# Patient Record
Sex: Female | Born: 1990 | Race: White | Hispanic: No | Marital: Single | Smoking: Former smoker
Health system: Southern US, Community
[De-identification: ages and names within clinical notes are randomized; demographics above are authoritative.]

## PROBLEM LIST (undated history)

## (undated) DIAGNOSIS — R87810 Cervical high risk human papillomavirus (HPV) DNA test positive: Secondary | ICD-10-CM

## (undated) DIAGNOSIS — B977 Papillomavirus as the cause of diseases classified elsewhere: Secondary | ICD-10-CM

## (undated) HISTORY — DX: Cervical high risk human papillomavirus (HPV) DNA test positive: R87.810

## (undated) HISTORY — DX: Papillomavirus as the cause of diseases classified elsewhere: B97.7

---
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
## 2008-11-03 ENCOUNTER — Ambulatory Visit: Payer: Self-pay | Admitting: Gynecology

## 2008-11-03 ENCOUNTER — Other Ambulatory Visit: Admission: RE | Admit: 2008-11-03 | Discharge: 2008-11-03 | Payer: Self-pay | Admitting: Gynecology

## 2008-11-03 ENCOUNTER — Encounter: Payer: Self-pay | Admitting: Gynecology

## 2008-11-20 ENCOUNTER — Ambulatory Visit: Payer: Self-pay | Admitting: Gynecology

## 2008-11-20 ENCOUNTER — Encounter: Payer: Self-pay | Admitting: Gynecology

## 2008-11-20 HISTORY — PX: COLPOSCOPY: SHX161

## 2009-06-26 ENCOUNTER — Other Ambulatory Visit: Admission: RE | Admit: 2009-06-26 | Discharge: 2009-06-26 | Payer: Self-pay | Admitting: Gynecology

## 2009-06-26 ENCOUNTER — Ambulatory Visit: Payer: Self-pay | Admitting: Gynecology

## 2009-06-26 ENCOUNTER — Encounter: Payer: Self-pay | Admitting: Gynecology

## 2009-11-04 ENCOUNTER — Other Ambulatory Visit: Admission: RE | Admit: 2009-11-04 | Discharge: 2009-11-04 | Payer: Self-pay | Admitting: Gynecology

## 2009-11-04 ENCOUNTER — Ambulatory Visit: Payer: Self-pay | Admitting: Gynecology

## 2010-06-03 ENCOUNTER — Ambulatory Visit: Payer: Self-pay | Admitting: Gynecology

## 2011-01-04 ENCOUNTER — Encounter (INDEPENDENT_AMBULATORY_CARE_PROVIDER_SITE_OTHER): Payer: 59 | Admitting: Gynecology

## 2011-01-04 ENCOUNTER — Other Ambulatory Visit: Payer: Self-pay | Admitting: Gynecology

## 2011-01-04 ENCOUNTER — Other Ambulatory Visit (HOSPITAL_COMMUNITY)
Admission: RE | Admit: 2011-01-04 | Discharge: 2011-01-04 | Disposition: A | Discharge status: Home / Self Care | Payer: 59 | Source: Ambulatory Visit | Attending: Gynecology | Admitting: Gynecology

## 2011-01-04 DIAGNOSIS — R82998 Other abnormal findings in urine: Secondary | ICD-10-CM

## 2011-01-04 DIAGNOSIS — Z113 Encounter for screening for infections with a predominantly sexual mode of transmission: Secondary | ICD-10-CM

## 2011-01-04 DIAGNOSIS — Z01419 Encounter for gynecological examination (general) (routine) without abnormal findings: Secondary | ICD-10-CM

## 2011-01-04 DIAGNOSIS — B373 Candidiasis of vulva and vagina: Secondary | ICD-10-CM

## 2011-01-04 DIAGNOSIS — Z124 Encounter for screening for malignant neoplasm of cervix: Secondary | ICD-10-CM | POA: Insufficient documentation

## 2011-05-10 ENCOUNTER — Ambulatory Visit (INDEPENDENT_AMBULATORY_CARE_PROVIDER_SITE_OTHER): Payer: 59 | Admitting: Gynecology

## 2011-05-10 ENCOUNTER — Encounter: Payer: Self-pay | Admitting: Gynecology

## 2011-05-10 VITALS — BP 110/72

## 2011-05-10 DIAGNOSIS — M940 Chondrocostal junction syndrome [Tietze]: Secondary | ICD-10-CM

## 2011-05-10 NOTE — Progress Notes (Signed)
Patient is a 20 year old gravida 0 who presented to the office today stating for the past 3-4 days she's noticed some tenderness on the midportion of her right breast but no discernible mass per se. She is on 28 day oral contraceptive pills having normal menstrual cycles. She denies any recent trauma or injury to the breast. She denied a nipple discharge. No family history of breast cancer.  Breast exam: Both breasts were examined in sitting supine position both breasts are symmetrical in appearance no skin discoloration or nipple inversion no palpable masses no skin retraction. Left breast no tenderness elicited during the exam right breast no tenderness on the breast at the area of concern appears to be new the sternum. She was tender the costochondral junction with the sternum.  Assessment: Costochondritis  Plan: Patient will be placed on Motrin 800 mg for 10 days. If this has not resolved her symptoms she'll return back to the office and we'll schedule an ultrasound of the right breast. She was reassured and we'll follow accordingly.

## 2011-05-25 ENCOUNTER — Encounter: Payer: Self-pay | Admitting: *Deleted

## 2012-01-12 ENCOUNTER — Other Ambulatory Visit: Payer: Self-pay | Admitting: Gynecology

## 2012-02-15 ENCOUNTER — Other Ambulatory Visit: Payer: Self-pay | Admitting: Gynecology

## 2012-03-21 ENCOUNTER — Encounter: Payer: 59 | Admitting: Gynecology

## 2012-03-28 ENCOUNTER — Encounter: Payer: 59 | Admitting: Gynecology

## 2012-06-06 ENCOUNTER — Encounter: Payer: Self-pay | Admitting: Gynecology

## 2012-06-06 ENCOUNTER — Ambulatory Visit (INDEPENDENT_AMBULATORY_CARE_PROVIDER_SITE_OTHER): Payer: 59 | Admitting: Gynecology

## 2012-06-06 VITALS — BP 110/70 | Ht 66.0 in | Wt 119.0 lb

## 2012-06-06 DIAGNOSIS — B9689 Other specified bacterial agents as the cause of diseases classified elsewhere: Secondary | ICD-10-CM

## 2012-06-06 DIAGNOSIS — A499 Bacterial infection, unspecified: Secondary | ICD-10-CM

## 2012-06-06 DIAGNOSIS — N898 Other specified noninflammatory disorders of vagina: Secondary | ICD-10-CM

## 2012-06-06 DIAGNOSIS — Z113 Encounter for screening for infections with a predominantly sexual mode of transmission: Secondary | ICD-10-CM

## 2012-06-06 DIAGNOSIS — N949 Unspecified condition associated with female genital organs and menstrual cycle: Secondary | ICD-10-CM

## 2012-06-06 DIAGNOSIS — Z01419 Encounter for gynecological examination (general) (routine) without abnormal findings: Secondary | ICD-10-CM

## 2012-06-06 DIAGNOSIS — N760 Acute vaginitis: Secondary | ICD-10-CM

## 2012-06-06 LAB — WET PREP FOR TRICH, YEAST, CLUE
Trich, Wet Prep: NONE SEEN
Yeast Wet Prep HPF POC: NONE SEEN

## 2012-06-06 MED ORDER — LEVONORGESTREL-ETHINYL ESTRAD 0.1-20 MG-MCG PO TABS: ORAL_TABLET | ORAL | Status: DC

## 2012-06-06 MED ORDER — METRONIDAZOLE 500 MG PO TABS: 500.0000 mg | ORAL_TABLET | Freq: Two times a day (BID) | ORAL | Status: DC

## 2012-06-06 NOTE — Patient Instructions (Addendum)
Breast Self-Exam A self breast exam may help you find changes or problems while they are still small. Do a breast self-exam:  Every month.  One week after your period (menstrual period).  On the first day of each month if you do not have periods anymore. Look for any:  Change in breast color, size, or shape.  Dimples in your breast.  Changes in your nipples or skin.  Dry skin on your breasts or nipples.  Watery or bloody discharge from your nipples.  Feel for:  Lumps.  Thick, hard places.  Any other changes. HOME CARE There are 3 ways to do the breast self-exam: In front of a mirror.  Lift your arms over your head and turn side to side.  Put your hands on your hips and lean down, then turn from side to side.  Bend forward and turn from side to side. In the shower.  With soapy hands, check both breasts. Then check above and below your collarbone and your armpits.  Feel above and below your collarbone down to under your breast, and from the center of your chest to the outer edge of the armpit. Check for any lumps or hard spots.  Using the tips of your middle three fingers check your whole breast by pressing your hand over your breast in a circle or in an up and down motion. Lying down.  Lie flat on your bed.  Put a small pillow under the breast you are going to check. On that same side, put your hand behind your head.  With your other hand, use the 3 middle fingers to feel the breast.  Move your fingers in a circle around the breast. Press firmly over all parts of the breast to feel for any lumps. GET HELP RIGHT AWAY IF: You find any changes in your breasts so they can be checked. Document Released: 01/25/2008 Document Revised: 10/31/2011 Document Reviewed: 11/26/2008 [employer] Patient Information 2013 [employer]  Bacterial Vaginosis Bacterial vaginosis (BV) is a vaginal infection where the normal balance of bacteria in the vagina is disrupted. The normal  balance is then replaced by an overgrowth of certain bacteria. There are several different kinds of bacteria that can cause BV. BV is the most common vaginal infection in women of childbearing age. CAUSES   The cause of BV is not fully understood. BV develops when there is an increase or imbalance of harmful bacteria.  Some activities or behaviors can upset the normal balance of bacteria in the vagina and put women at increased risk including:  Having a new sex partner or multiple sex partners.  Douching.  Using an intrauterine device (IUD) for contraception.  It is not clear what role sexual activity plays in the development of BV. However, women that have never had sexual intercourse are rarely infected with BV. Women do not get BV from toilet seats, bedding, swimming pools or from touching objects around them.  SYMPTOMS   Grey vaginal discharge.  A fish-like odor with discharge, especially after sexual intercourse.  Itching or burning of the vagina and vulva.  Burning or pain with urination.  Some women have no signs or symptoms at all. DIAGNOSIS  Your caregiver must examine the vagina for signs of BV. Your caregiver will perform lab tests and look at the sample of vaginal fluid through a microscope. They will look for bacteria and abnormal cells (clue cells), a pH test higher than 4.5, and a positive amine test all associated with BV.  RISKS AND COMPLICATIONS   Pelvic inflammatory disease (PID).  Infections following gynecology surgery.  Developing HIV.  Developing herpes virus. TREATMENT  Sometimes BV will clear up without treatment. However, all women with symptoms of BV should be treated to avoid complications, especially if gynecology surgery is planned. Female partners generally do not need to be treated. However, BV may spread between female sex partners so treatment is helpful in preventing a recurrence of BV.   BV may be treated with antibiotics. The antibiotics come  in either pill or vaginal cream forms. Either can be used with nonpregnant or pregnant women, but the recommended dosages differ. These antibiotics are not harmful to the baby.  BV can recur after treatment. If this happens, a second round of antibiotics will often be prescribed.  Treatment is important for pregnant women. If not treated, BV can cause a premature delivery, especially for a pregnant woman who had a premature birth in the past. All pregnant women who have symptoms of BV should be checked and treated.  For chronic reoccurrence of BV, treatment with a type of prescribed gel vaginally twice a week is helpful. HOME CARE INSTRUCTIONS   Finish all medication as directed by your caregiver.  Do not have sex until treatment is completed.  Tell your sexual partner that you have a vaginal infection. They should see their caregiver and be treated if they have problems, such as a mild rash or itching.  Practice safe sex. Use condoms. Only have 1 sex partner. PREVENTION  Basic prevention steps can help reduce the risk of upsetting the natural balance of bacteria in the vagina and developing BV:  Do not have sexual intercourse (be abstinent).  Do not douche.  Use all of the medicine prescribed for treatment of BV, even if the signs and symptoms go away.  Tell your sex partner if you have BV. That way, they can be treated, if needed, to prevent reoccurrence. SEEK MEDICAL CARE IF:   Your symptoms are not improving after 3 days of treatment.  You have increased discharge, pain, or fever. MAKE SURE YOU:   Understand these instructions.  Will watch your condition.  Will get help right away if you are not doing well or get worse. FOR MORE INFORMATION  Division of STD Prevention (DSTDP), Centers for Disease Control and Prevention: [employer] American Social Health Association (ASHA): www.ashastd.org  Document Released: 08/08/2005 Document Revised: 10/31/2011 Document Reviewed:  01/29/2009 [facility] Patient Information 2013 [place], [place].  Oral Contraception Use Oral contraceptives (OCs) are medicines taken to prevent pregnancy. OCs work by preventing the ovaries from releasing eggs. The hormones in OCs also cause the cervical mucus to thicken, preventing the sperm from entering the uterus. The hormones also cause the uterine lining to become thin, not allowing a fertilized egg to attach to the inside of the uterus. OCs are highly effective when taken exactly as prescribed. However, OCs do not prevent sexually transmitted diseases (STDs). Safe sex practices, such as using condoms along with an OC, can help prevent STDs.  Before taking OCs, you may have a physical exam and Pap test. Your caregiver may also order blood tests if necessary. Your caregiver will make sure you are a good candidate for oral contraception. Discuss with your caregiver the possible side effects of the OC you may be prescribed. When starting an OC, it can take 2 to 3 months for the body to adjust to the changes in hormone levels in your body.  HOW TO TAKE ORAL  CONTRACEPTIVES Your caregiver may advise you on how to start taking the first cycle of OCs. Otherwise, you can:  Start on day 1 of your menstrual period. You will not need any backup contraceptive protection with this start time.  Start on the first Sunday after your menstrual period or the day you get your prescription. In these cases, you will need to use backup contraceptive protection for the first 7-day cycle. After you have started taking OCs:  If you forget to take 1 pill, take it as soon as you remember. Take the next pill at the regular time.  If you miss 2 or more pills, use backup birth control until your next menstrual period starts.  If you use a 28-day pack that contains inactive pills and you miss 1 of the last 7 pills (pills with no hormones), it will not matter. Throw away the rest of the non-hormone pills and start a new pill  pack. No matter which day you start the OC, you will always start a new pack on that same day of the week. Have an extra pack of OCs and a backup contraceptive method available in case you miss some pills or lose your OC pack. HOME CARE INSTRUCTIONS   Do not smoke.  Always use a condom to protect against STDs. OCs do not protect against STDs.  Use a calendar to mark your menstrual period days.  Read the information and directions that come with your OC. Talk to your caregiver if you have questions. SEEK MEDICAL CARE IF:   You develop nausea and vomiting.  You have abnormal vaginal discharge or bleeding.  You develop a rash.  You miss your menstrual period.  You are losing your hair.  You need treatment for mood swings or depression.  You get dizzy when taking the OC.  You develop acne from taking the OC.  You become pregnant. SEEK IMMEDIATE MEDICAL CARE IF:   You develop chest pain.  You develop shortness of breath.  You have an uncontrolled or severe headache.  You develop numbness or slurred speech.  You develop visual problems.  You develop pain, redness, and swelling in the legs. Document Released: 07/28/2011 Document Revised: 10/31/2011 Document Reviewed: 07/28/2011 [facility] Patient Information 2013 [place], [place].

## 2012-06-06 NOTE — Progress Notes (Signed)
Wendy Calderon 1990-11-03 [id number]   History:    21 y.o.  for annual gyn exam was complaining of vaginal discharge. Patient had ran out of her oral contraceptive pill Aleese. Has used condoms at times during intercourse. Her last menstrual cycle was approximately one week. Her cycles i and a few days ago. Patient wanted to have an STD screen. Patient frequently does her self breast examination. She has a steady sexual partner.  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 Colposcopy was negative ECC was benign. Followup Pap smears have been normal.  Past medical history,surgical history, family history and social history were all reviewed and documented in the EPIC chart.  Gynecologic History Patient's last menstrual period was 05/26/2012. Contraception: none Last Pap: 2012. Results were: normal Last mammogram: Not indicated. Results were: Not indicated  Obstetric History OB History    Grav Para Term Preterm Abortions TAB SAB Ect Mult Living                   ROS: A ROS was performed and pertinent positives and negatives are included in the history.  GENERAL: No fevers or chills. HEENT: No change in vision, no earache, sore throat or sinus congestion. NECK: No pain or stiffness. CARDIOVASCULAR: No chest pain or pressure. No palpitations. PULMONARY: No shortness of breath, cough or wheeze. GASTROINTESTINAL: No abdominal pain, nausea, vomiting or diarrhea, melena or bright red blood per rectum. GENITOURINARY: No urinary frequency, urgency, hesitancy or dysuria. MUSCULOSKELETAL: No joint or muscle pain, no back pain, no recent trauma. DERMATOLOGIC: No rash, no itching, no lesions. ENDOCRINE: No polyuria, polydipsia, no heat or cold intolerance. No recent change in weight. HEMATOLOGICAL: No anemia or easy bruising or bleeding. NEUROLOGIC: No headache, seizures, numbness, tingling or weakness.  PSYCHIATRIC: No depression, no loss of interest in normal activity or change in sleep pattern.     Exam: chaperone present  BP 110/70  Ht 5\' 6"  (1.676 m)  Wt 119 lb (53.978 kg)  BMI 19.21 kg/m2  LMP 05/26/2012  Body mass index is 19.21 kg/(m^2).  General appearance : Well developed well nourished female. No acute distress HEENT: Neck supple, trachea midline, no carotid bruits, no thyroidmegaly Lungs: Clear to auscultation, no rhonchi or wheezes, or rib retractions  Heart: Regular rate and rhythm, no murmurs or gallops Breast:Examined in sitting and supine position were symmetrical in appearance, no palpable masses or tenderness,  no skin retraction, no nipple inversion, no nipple discharge, no skin discoloration, no axillary or supraclavicular lymphadenopathy Abdomen: no palpable masses or tenderness, no rebound or guarding Extremities: no edema or skin discoloration or tenderness  Pelvic:  Bartholin, Urethra, Skene Glands: Within normal limits             Vagina: No gross lesions fishy odor creation discharge  Cervix: No gross lesions or discharge  Uterus  anteverted, normal size, shape and consistency, non-tender and mobile  Adnexa  Without masses or tenderness  Anus and perineum  normal   Rectovaginal  normal sphincter tone without palpated masses or tenderness             Hemoccult not indicated   Wet prep positive Amine, moderate clue cells many white blood cells and too numerous to count bacteria  Assessment/Plan:  21 y.o. female for annual exam with clinical evidence of bacterial vaginosis. Patient will be started on Flagyl 500 mg one by mouth  twice a day for 5 days. Prescription refill for Aleese was provided. Literature formation on self breast examination was provided. Patient was offered flu vaccine declined. Patient declined any blood work. GC and Chlamydia culture was done today resulting in time of this dictation. New Pap smear screening guidelines discussed. No Pap  smear done today.   Ok Edwards MD, 4:46 PM 06/06/2012

## 2012-06-07 LAB — URINALYSIS W MICROSCOPIC + REFLEX CULTURE
Crystals: NONE SEEN
Leukocytes, UA: NEGATIVE
Protein, ur: 30 mg/dL — AB
Specific Gravity, Urine: 1.022 (ref 1.005–1.030)
Squamous Epithelial / LPF: NONE SEEN
Urobilinogen, UA: 1 mg/dL (ref 0.0–1.0)

## 2012-06-07 LAB — GC/CHLAMYDIA PROBE AMP, GENITAL: Chlamydia, DNA Probe: NEGATIVE

## 2013-04-23 ENCOUNTER — Ambulatory Visit (INDEPENDENT_AMBULATORY_CARE_PROVIDER_SITE_OTHER): Payer: 59 | Admitting: Gynecology

## 2013-04-23 ENCOUNTER — Encounter: Payer: Self-pay | Admitting: Gynecology

## 2013-04-23 DIAGNOSIS — N898 Other specified noninflammatory disorders of vagina: Secondary | ICD-10-CM

## 2013-04-23 DIAGNOSIS — B9689 Other specified bacterial agents as the cause of diseases classified elsewhere: Secondary | ICD-10-CM

## 2013-04-23 DIAGNOSIS — A499 Bacterial infection, unspecified: Secondary | ICD-10-CM

## 2013-04-23 DIAGNOSIS — N760 Acute vaginitis: Secondary | ICD-10-CM

## 2013-04-23 LAB — WET PREP FOR TRICH, YEAST, CLUE: Trich, Wet Prep: NONE SEEN

## 2013-04-23 MED ORDER — METRONIDAZOLE 500 MG PO TABS: 500.0000 mg | ORAL_TABLET | Freq: Two times a day (BID) | ORAL | Status: DC

## 2013-04-23 NOTE — Patient Instructions (Signed)
Take Flagyl medication twice daily for 7 days. Avoid alcohol while taking. 

## 2013-04-23 NOTE — Progress Notes (Signed)
Patient presents complaining of vaginal discharge with odor for the past week. Does have a past history of bacterial vaginosis and said that it feels the same way. No urinary symptoms.  Exam with Kim assistant Abdomen soft nontender without masses guarding rebound organomegaly. Pelvic external BUS vagina with abundant white discharge. Cervix normal. Uterus normal size, mobile nontender. Adnexa without masses or tenderness.  Assessment and plan: History, exam and wet prep consistent with bacterial vaginosis. Treat with Flagyl 500 mg twice a day x7 days, alcohol avoidance reviewed. Followup if symptoms persist, worsen or recur.

## 2013-06-12 ENCOUNTER — Other Ambulatory Visit: Payer: Self-pay | Admitting: Gynecology

## 2013-06-12 NOTE — Telephone Encounter (Signed)
Will call pt to schedule annual

## 2013-08-16 ENCOUNTER — Other Ambulatory Visit: Payer: Self-pay | Admitting: Gynecology

## 2013-10-10 ENCOUNTER — Encounter: Payer: Self-pay | Admitting: Gynecology

## 2013-10-10 ENCOUNTER — Ambulatory Visit (INDEPENDENT_AMBULATORY_CARE_PROVIDER_SITE_OTHER): Payer: 59 | Admitting: Gynecology

## 2013-10-10 ENCOUNTER — Other Ambulatory Visit (HOSPITAL_COMMUNITY)
Admission: RE | Admit: 2013-10-10 | Discharge: 2013-10-10 | Disposition: A | Discharge status: Home / Self Care | Payer: 59 | Source: Ambulatory Visit | Attending: Gynecology | Admitting: Gynecology

## 2013-10-10 VITALS — BP 98/68 | Ht 67.0 in | Wt 122.4 lb

## 2013-10-10 DIAGNOSIS — Z01419 Encounter for gynecological examination (general) (routine) without abnormal findings: Secondary | ICD-10-CM | POA: Insufficient documentation

## 2013-10-10 DIAGNOSIS — Z113 Encounter for screening for infections with a predominantly sexual mode of transmission: Secondary | ICD-10-CM

## 2013-10-10 MED ORDER — LEVONORGESTREL-ETHINYL ESTRAD 0.1-20 MG-MCG PO TABS: 1.0000 | ORAL_TABLET | Freq: Every day | ORAL | Status: DC

## 2013-10-10 NOTE — Addendum Note (Signed)
Addended by: Bertram SavinGONZALEZ-CASTILLO, BLANCA A on: 10/10/2013 10:34 AM   Modules accepted: Orders

## 2013-10-10 NOTE — Patient Instructions (Signed)

## 2013-10-10 NOTE — Progress Notes (Signed)
    Wendy Calderon [birth year] [id number]   History:    23 y.o.  for annual gyn exam with no complaints today. Patient on oral contraceptive pill having normal cycles. Patient sexually active no change in partners. Patient had a normal Pap smear 2012.  Past medical history,surgical history, family history and social history were all reviewed and documented in the EPIC chart.  Gynecologic History Patient's last menstrual period was 09/09/2013. Contraception: OCP (estrogen/progesterone) Last Pap: 2012. Results were: normal Last mammogram: Not indicated. Results were: None indicated  Obstetric History OB History  No data available     ROS: A ROS was performed and pertinent positives and negatives are included in the history.  GENERAL: No fevers or chills. HEENT: No change in vision, no earache, sore throat or sinus congestion. NECK: No pain or stiffness. CARDIOVASCULAR: No chest pain or pressure. No palpitations. PULMONARY: No shortness of breath, cough or wheeze. GASTROINTESTINAL: No abdominal pain, nausea, vomiting or diarrhea, melena or bright red blood per rectum. GENITOURINARY: No urinary frequency, urgency, hesitancy or dysuria. MUSCULOSKELETAL: No joint or muscle pain, no back pain, no recent trauma. DERMATOLOGIC: No rash, no itching, no lesions. ENDOCRINE: No polyuria, polydipsia, no heat or cold intolerance. No recent change in weight. HEMATOLOGICAL: No anemia or easy bruising or bleeding. NEUROLOGIC: No headache, seizures, numbness, tingling or weakness. PSYCHIATRIC: No depression, no loss of interest in normal activity or change in sleep pattern.     Exam: chaperone present  BP 98/68  Ht 5\' 7"  (1.702 m)  Wt 122 lb 6.4 oz (55.52 kg)  BMI 19.17 kg/m2  LMP 09/09/2013  Body mass index is 19.17 kg/(m^2).  General appearance : Well developed well nourished female. No acute distress HEENT: Neck supple, trachea midline, no carotid bruits, no thyroidmegaly Lungs: Clear to  auscultation, no rhonchi or wheezes, or rib retractions  Heart: Regular rate and rhythm, no murmurs or gallops Breast:Examined in sitting and supine position were symmetrical in appearance, no palpable masses or tenderness,  no skin retraction, no nipple inversion, no nipple discharge, no skin discoloration, no axillary or supraclavicular lymphadenopathy Abdomen: no palpable masses or tenderness, no rebound or guarding Extremities: no edema or skin discoloration or tenderness  Pelvic:  Bartholin, Urethra, Skene Glands: Within normal limits             Vagina: No gross lesions or discharge  Cervix: No gross lesions or discharge  Uterus  anteverted, normal size, shape and consistency, non-tender and mobile  Adnexa  Without masses or tenderness  Anus and perineum  normal   Rectovaginal  normal sphincter tone without palpated masses or tenderness             Hemoccult indicated     Assessment/Plan:  23 y.o. female for annual exam will have a CBC, screening cholesterol and urinalysis along with Pap smear today. Patient was reminded on the importance of monthly breast exam. Literature and information on alternatives forms of contraception were provided to include IUD, transdermal implant, or vaginal ring.  Note: This dictation was prepared with  Dragon/digital dictation along withSmart phrase technology. Any transcriptional errors that result from this process are unintentional.   Ok EdwardsFERNANDEZ,JUAN H MD, 10:29 AM 10/10/2013

## 2013-10-11 ENCOUNTER — Other Ambulatory Visit: Payer: Self-pay | Admitting: Gynecology

## 2013-10-11 LAB — URINALYSIS W MICROSCOPIC + REFLEX CULTURE
Bacteria, UA: NONE SEEN
Bilirubin Urine: NEGATIVE
CRYSTALS: NONE SEEN
Casts: NONE SEEN
Glucose, UA: NEGATIVE mg/dL
HGB URINE DIPSTICK: NEGATIVE
KETONES UR: NEGATIVE mg/dL
Leukocytes, UA: NEGATIVE
NITRITE: NEGATIVE
Protein, ur: NEGATIVE mg/dL
SPECIFIC GRAVITY, URINE: 1.016 (ref 1.005–1.030)
Squamous Epithelial / LPF: NONE SEEN
UROBILINOGEN UA: 0.2 mg/dL (ref 0.0–1.0)
pH: 6 (ref 5.0–8.0)

## 2013-10-11 LAB — GC/CHLAMYDIA PROBE AMP
CT PROBE, AMP APTIMA: POSITIVE — AB
GC Probe RNA: NEGATIVE

## 2013-10-11 MED ORDER — AZITHROMYCIN 500 MG PO TABS: 1000.0000 mg | ORAL_TABLET | Freq: Once | ORAL | Status: DC

## 2013-11-05 ENCOUNTER — Ambulatory Visit: Payer: 59 | Admitting: Gynecology

## 2014-10-06 ENCOUNTER — Other Ambulatory Visit: Payer: Self-pay | Admitting: Gynecology

## 2015-04-21 ENCOUNTER — Ambulatory Visit (INDEPENDENT_AMBULATORY_CARE_PROVIDER_SITE_OTHER): Payer: 59 | Admitting: Gynecology

## 2015-04-21 ENCOUNTER — Encounter: Payer: Self-pay | Admitting: Gynecology

## 2015-04-21 VITALS — BP 104/78 | Ht 67.0 in | Wt 132.0 lb

## 2015-04-21 DIAGNOSIS — Z01419 Encounter for gynecological examination (general) (routine) without abnormal findings: Secondary | ICD-10-CM

## 2015-04-21 DIAGNOSIS — Z113 Encounter for screening for infections with a predominantly sexual mode of transmission: Secondary | ICD-10-CM

## 2015-04-21 LAB — CBC WITH DIFFERENTIAL/PLATELET
Basophils Absolute: 0 10*3/uL (ref 0.0–0.1)
Basophils Relative: 0 % (ref 0–1)
EOS ABS: 0.1 10*3/uL (ref 0.0–0.7)
Eosinophils Relative: 2 % (ref 0–5)
HCT: 42.3 % (ref 36.0–46.0)
Hemoglobin: 14 g/dL (ref 12.0–15.0)
LYMPHS ABS: 1.6 10*3/uL (ref 0.7–4.0)
Lymphocytes Relative: 31 % (ref 12–46)
MCH: 30.8 pg (ref 26.0–34.0)
MCHC: 33.1 g/dL (ref 30.0–36.0)
MCV: 93.2 fL (ref 78.0–100.0)
MPV: 11.3 fL (ref 8.6–12.4)
Monocytes Absolute: 0.5 10*3/uL (ref 0.1–1.0)
Monocytes Relative: 9 % (ref 3–12)
Neutro Abs: 3.1 10*3/uL (ref 1.7–7.7)
Neutrophils Relative %: 58 % (ref 43–77)
Platelets: 215 10*3/uL (ref 150–400)
RBC: 4.54 MIL/uL (ref 3.87–5.11)
RDW: 13.4 % (ref 11.5–15.5)
WBC: 5.3 10*3/uL (ref 4.0–10.5)

## 2015-04-21 LAB — CHOLESTEROL, TOTAL: Cholesterol: 136 mg/dL (ref 125–200)

## 2015-04-21 MED ORDER — LEVONORGESTREL-ETHINYL ESTRAD 0.1-20 MG-MCG PO TABS: 1.0000 | ORAL_TABLET | Freq: Every day | ORAL | Status: DC

## 2015-04-21 NOTE — Progress Notes (Signed)
    Wendy Calderon 06/12/1991 [id number]   History:    24 y.o.  for annual gyn exam with no complaints today. Patient was due to return to the office in February this year and for personal reasons was not able to make that appointment. She ran out of the birth control pill. Had been using condoms. When she did have intercourse. She reports normal menstrual cycles and would like to return back on the oral contraception pill. Patient has completed the HPV vaccine series in the past. Patient with no prior history of any abnormal Pap smears.  Past medical history,surgical history, family history and social history were all reviewed and documented in the EPIC chart.  Gynecologic History Patient's last menstrual period was 04/12/2015. Contraception: condoms Last Pap: 2015. Results were: normal Last mammogram: Not indicated. Results were: Not indicated  Obstetric History OB History  No data available     ROS: A ROS was performed and pertinent positives and negatives are included in the history.  GENERAL: No fevers or chills. HEENT: No change in vision, no earache, sore throat or sinus congestion. NECK: No pain or stiffness. CARDIOVASCULAR: No chest pain or pressure. No palpitations. PULMONARY: No shortness of breath, cough or wheeze. GASTROINTESTINAL: No abdominal pain, nausea, vomiting or diarrhea, melena or bright red blood per rectum. GENITOURINARY: No urinary frequency, urgency, hesitancy or dysuria. MUSCULOSKELETAL: No joint or muscle pain, no back pain, no recent trauma. DERMATOLOGIC: No rash, no itching, no lesions. ENDOCRINE: No polyuria, polydipsia, no heat or cold intolerance. No recent change in weight. HEMATOLOGICAL: No anemia or easy bruising or bleeding. NEUROLOGIC: No headache, seizures, numbness, tingling or weakness. PSYCHIATRIC: No depression, no loss of interest in normal activity or change in sleep pattern.     Exam: chaperone present  BP 104/78 mmHg  Ht  (1.702 m)  Wt  132 lb (59.875 kg)  BMI 20.67 kg/m2  LMP 04/12/2015  Body mass index is 20.67 kg/(m^2).  General appearance : Well developed well nourished female. No acute distress HEENT: Eyes: no retinal hemorrhage or exudates,  Neck supple, trachea midline, no carotid bruits, no thyroidmegaly Lungs: Clear to auscultation, no rhonchi or wheezes, or rib retractions  Heart: Regular rate and rhythm, no murmurs or gallops Breast:Examined in sitting and supine position were symmetrical in appearance, no palpable masses or tenderness,  no skin retraction, no nipple inversion, no nipple discharge, no skin discoloration, no axillary or supraclavicular lymphadenopathy Abdomen: no palpable masses or tenderness, no rebound or guarding Extremities: no edema or skin discoloration or tenderness  Pelvic:  Bartholin, Urethra, Skene Glands: Within normal limits             Vagina: No gross lesions or discharge  Cervix: No gross lesions or discharge  Uterus  anteverted, normal size, shape and consistency, non-tender and mobile  Adnexa  Without masses or tenderness  Anus and perineum  normal   Rectovaginal  normal sphincter tone without palpated masses or tenderness             Hemoccult not indicated     Assessment/Plan:  24 y.o. female for annual exam will be restarted back on the Junel 1/20 oral contraception pill. Pap smear not indicated today. GC and Chlamydia culture obtained. A CBC, screening cholesterol and urinalysis part of her annual exam will be obtained today. Patient was informed on the importance of monthly breast exam.   Ok Edwards MD, 5:01 PM 04/21/2015

## 2015-04-21 NOTE — Patient Instructions (Signed)
Oral Contraception Information Oral contraceptive pills (OCPs) are medicines taken to prevent pregnancy. OCPs work by preventing the ovaries from releasing eggs. The hormones in OCPs also cause the cervical mucus to thicken, preventing the sperm from entering the uterus. The hormones also cause the uterine lining to become thin, not allowing a fertilized egg to attach to the inside of the uterus. OCPs are highly effective when taken exactly as prescribed. However, OCPs do not prevent sexually transmitted diseases (STDs). Safe sex practices, such as using condoms along with the pill, can help prevent STDs.  Before taking the pill, you may have a physical exam and Pap test. Your health care provider may order blood tests. The health care provider will make sure you are a good candidate for oral contraception. Discuss with your health care provider the possible side effects of the OCP you may be prescribed. When starting an OCP, it can take 2 to 3 months for the body to adjust to the changes in hormone levels in your body.  TYPES OF ORAL CONTRACEPTION  The combination pill--This pill contains estrogen and progestin (synthetic progesterone) hormones. The combination pill comes in 21-day, 28-day, or 91-day packs. Some types of combination pills are meant to be taken continuously (365-day pills). With 21-day packs, you do not take pills for 7 days after the last pill. With 28-day packs, the pill is taken every day. The last 7 pills are without hormones. Certain types of pills have more than 21 hormone-containing pills. With 91-day packs, the first 84 pills contain both hormones, and the last 7 pills contain no hormones or contain estrogen only.  The minipill--This pill contains the progesterone hormone only. The pill is taken every day continuously. It is very important to take the pill at the same time each day. The minipill comes in packs of 28 pills. All 28 pills contain the hormone.  ADVANTAGES OF ORAL  CONTRACEPTIVE PILLS  Decreases premenstrual symptoms.   Treats menstrual period cramps.   Regulates the menstrual cycle.   Decreases a heavy menstrual flow.   May treatacne, depending on the type of pill.   Treats abnormal uterine bleeding.   Treats polycystic ovarian syndrome.   Treats endometriosis.   Can be used as emergency contraception.  THINGS THAT CAN MAKE ORAL CONTRACEPTIVE PILLS LESS EFFECTIVE OCPs can be less effective if:   You forget to take the pill at the same time every day.   You have a stomach or intestinal disease that lessens the absorption of the pill.   You take OCPs with other medicines that make OCPs less effective, such as antibiotics, certain HIV medicines, and some seizure medicines.   You take expired OCPs.   You forget to restart the pill on day 7, when using the packs of 21 pills.  RISKS ASSOCIATED WITH ORAL CONTRACEPTIVE PILLS  Oral contraceptive pills can sometimes cause side effects, such as:  Headache.  Nausea.  Breast tenderness.  Irregular bleeding or spotting. Combination pills are also associated with a small increased risk of:  Blood clots.  Heart attack.  Stroke. Document Released: 10/29/2002 Document Revised: 05/29/2013 Document Reviewed: 01/27/2013 [facility] Patient Information 2015 [place], [place]. This information is not intended to replace advice given to you by your health care provider. Make sure you discuss any questions you have with your health care provider. Breast Self-Awareness Practicing breast self-awareness may pick up problems early, prevent significant medical complications, and possibly save your life. By practicing breast self-awareness, you can become familiar with  how your breasts look and feel and if your breasts are changing. This allows you to notice changes early. It can also offer you some reassurance that your breast health is good. One way to learn what is normal for your breasts and  whether your breasts are changing is to do a breast self-exam. If you find a lump or something that was not present in the past, it is best to contact your caregiver right away. Other findings that should be evaluated by your caregiver include nipple discharge, especially if it is bloody; skin changes or reddening; areas where the skin seems to be pulled in (retracted); or new lumps and bumps. Breast pain is seldom associated with cancer (malignancy), but should also be evaluated by a caregiver. HOW TO PERFORM A BREAST SELF-EXAM The best time to examine your breasts is 5-7 days after your menstrual period is over. During menstruation, the breasts are lumpier, and it may be more difficult to pick up changes. If you do not menstruate, have reached menopause, or had your uterus removed (hysterectomy), you should examine your breasts at regular intervals, such as monthly. If you are breastfeeding, examine your breasts after a feeding or after using a breast pump. Breast implants do not decrease the risk for lumps or tumors, so continue to perform breast self-exams as recommended. Talk to your caregiver about how to determine the difference between the implant and breast tissue. Also, talk about the amount of pressure you should use during the exam. Over time, you will become more familiar with the variations of your breasts and more comfortable with the exam. A breast self-exam requires you to remove all your clothes above the waist.  Look at your breasts and nipples. Stand in front of a mirror in a room with good lighting. With your hands on your hips, push your hands firmly downward. Look for a difference in shape, contour, and size from one breast to the other (asymmetry). Asymmetry includes puckers, dips, or bumps. Also, look for skin changes, such as reddened or scaly areas on the breasts. Look for nipple changes, such as discharge, dimpling, repositioning, or redness.  Carefully feel your breasts. This is  best done either in the shower or tub while using soapy water or when flat on your back. Place the arm (on the side of the breast you are examining) above your head. Use the pads (not the fingertips) of your three middle fingers on your opposite hand to feel your breasts. Start in the underarm area and use  inch (2 cm) overlapping circles to feel your breast. Use 3 different levels of pressure (light, medium, and firm pressure) at each circle before moving to the next circle. The light pressure is needed to feel the tissue closest to the skin. The medium pressure will help to feel breast tissue a little deeper, while the firm pressure is needed to feel the tissue close to the ribs. Continue the overlapping circles, moving downward over the breast until you feel your ribs below your breast. Then, move one finger-width towards the center of the body. Continue to use the  inch (2 cm) overlapping circles to feel your breast as you move slowly up toward the collar bone (clavicle) near the base of the neck. Continue the up and down exam using all 3 pressures until you reach the middle of the chest. Do this with each breast, carefully feeling for lumps or changes.   Keep a written record with breast changes or normal findings  for each breast. By writing this information down, you do not need to depend only on memory for size, tenderness, or location. Write down where you are in your menstrual cycle, if you are still menstruating. Breast tissue can have some lumps or thick tissue. However, see your caregiver if you find anything that concerns you.  SEEK MEDICAL CARE IF:  You see a change in shape, contour, or size of your breasts or nipples.   You see skin changes, such as reddened or scaly areas on the breasts or nipples.   You have an unusual discharge from your nipples.   You feel a new lump or unusually thick areas.  Document Released: 08/08/2005 Document Revised: 07/25/2012 Document Reviewed:  11/23/2011 [facility] Patient Information 2015 [place]. This information is not intended to replace advice given to you by your health care provider. Make sure you discuss any questions you have with your health care provider.

## 2015-04-22 LAB — URINALYSIS W MICROSCOPIC + REFLEX CULTURE
BACTERIA UA: NONE SEEN [HPF]
Bilirubin Urine: NEGATIVE
CRYSTALS: NONE SEEN [HPF]
Casts: NONE SEEN [LPF]
GLUCOSE, UA: NEGATIVE
Hgb urine dipstick: NEGATIVE
Ketones, ur: NEGATIVE
LEUKOCYTES UA: NEGATIVE
Nitrite: NEGATIVE
PROTEIN: NEGATIVE
RBC / HPF: NONE SEEN RBC/HPF (ref <=2)
SPECIFIC GRAVITY, URINE: 1.006 (ref 1.001–1.035)
Squamous Epithelial / LPF: NONE SEEN [HPF] (ref <=5)
WBC, UA: NONE SEEN WBC/HPF (ref <=5)
YEAST: NONE SEEN [HPF]
pH: 7.5 (ref 5.0–8.0)

## 2015-04-22 LAB — HIV ANTIBODY (ROUTINE TESTING W REFLEX): HIV: NONREACTIVE

## 2015-04-23 LAB — GC/CHLAMYDIA PROBE AMP
CT Probe RNA: NEGATIVE
GC Probe RNA: NEGATIVE

## 2015-10-07 ENCOUNTER — Other Ambulatory Visit: Payer: Self-pay | Admitting: Gastroenterology

## 2015-10-07 DIAGNOSIS — R1013 Epigastric pain: Secondary | ICD-10-CM

## 2015-10-07 DIAGNOSIS — R112 Nausea with vomiting, unspecified: Secondary | ICD-10-CM

## 2015-10-16 ENCOUNTER — Other Ambulatory Visit: Payer: Self-pay

## 2015-10-26 ENCOUNTER — Ambulatory Visit
Admission: RE | Admit: 2015-10-26 | Discharge: 2015-10-26 | Disposition: A | Discharge status: Home / Self Care | Payer: 59 | Source: Ambulatory Visit | Attending: Gastroenterology | Admitting: Gastroenterology

## 2015-10-26 DIAGNOSIS — R1013 Epigastric pain: Secondary | ICD-10-CM

## 2015-10-26 DIAGNOSIS — R112 Nausea with vomiting, unspecified: Secondary | ICD-10-CM

## 2016-05-03 ENCOUNTER — Other Ambulatory Visit: Payer: Self-pay | Admitting: Gynecology

## 2016-05-30 ENCOUNTER — Telehealth: Payer: Self-pay | Admitting: *Deleted

## 2016-05-30 MED ORDER — LEVONORGESTREL-ETHINYL ESTRAD 0.1-20 MG-MCG PO TABS: 1.0000 | ORAL_TABLET | Freq: Every day | ORAL | 0 refills | Status: DC

## 2016-05-31 NOTE — Telephone Encounter (Signed)
Pharmacy faxed refill request. 

## 2016-06-03 ENCOUNTER — Ambulatory Visit (INDEPENDENT_AMBULATORY_CARE_PROVIDER_SITE_OTHER): Payer: 59 | Admitting: Gynecology

## 2016-06-03 ENCOUNTER — Encounter: Payer: Self-pay | Admitting: Gynecology

## 2016-06-03 VITALS — BP 120/78 | Ht 67.0 in | Wt 130.0 lb

## 2016-06-03 DIAGNOSIS — Z01419 Encounter for gynecological examination (general) (routine) without abnormal findings: Secondary | ICD-10-CM

## 2016-06-03 DIAGNOSIS — Z113 Encounter for screening for infections with a predominantly sexual mode of transmission: Secondary | ICD-10-CM | POA: Diagnosis not present

## 2016-06-03 MED ORDER — LEVONORGESTREL-ETHINYL ESTRAD 0.1-20 MG-MCG PO TABS: 1.0000 | ORAL_TABLET | Freq: Every day | ORAL | 4 refills | Status: AC

## 2016-06-03 NOTE — Progress Notes (Signed)
    Wendy Calderon [birth year] [id number]   History:    25 y.o.  for annual gyn exam with no complaints today. Patient requesting refill for her oral contraceptive pill. She's having normal menstrual cycles. Review of her record indicated many years ago she completed the Gardasil Vaccine. Patient had an STD screen last year and since then has had a new partner and would like to be retested. Patient declined flu vaccine today. No prior history of abnormal Pap smear.  Past medical history,surgical history, family history and social history were all reviewed and documented in the EPIC chart.  Gynecologic History Patient's last menstrual period was 05/22/2016. Contraception: OCP (estrogen/progesterone) Last Pap: 2012 and 2015. Results were: normal Last mammogram: Not indicated. Results were: Not indicated  Obstetric History OB History  No data available     ROS: A ROS was performed and pertinent positives and negatives are included in the history.  GENERAL: No fevers or chills. HEENT: No change in vision, no earache, sore throat or sinus congestion. NECK: No pain or stiffness. CARDIOVASCULAR: No chest pain or pressure. No palpitations. PULMONARY: No shortness of breath, cough or wheeze. GASTROINTESTINAL: No abdominal pain, nausea, vomiting or diarrhea, melena or bright red blood per rectum. GENITOURINARY: No urinary frequency, urgency, hesitancy or dysuria. MUSCULOSKELETAL: No joint or muscle pain, no back pain, no recent trauma. DERMATOLOGIC: No rash, no itching, no lesions. ENDOCRINE: No polyuria, polydipsia, no heat or cold intolerance. No recent change in weight. HEMATOLOGICAL: No anemia or easy bruising or bleeding. NEUROLOGIC: No headache, seizures, numbness, tingling or weakness. PSYCHIATRIC: No depression, no loss of interest in normal activity or change in sleep pattern.     Exam: chaperone present  BP 120/78   Ht 5\' 7"  (1.702 m)   Wt 130 lb (59 kg)   LMP 05/22/2016   BMI 20.36  kg/m   Body mass index is 20.36 kg/m.  General appearance : Well developed well nourished female. No acute distress HEENT: Eyes: no retinal hemorrhage or exudates,  Neck supple, trachea midline, no carotid bruits, no thyroidmegaly Lungs: Clear to auscultation, no rhonchi or wheezes, or rib retractions  Heart: Regular rate and rhythm, no murmurs or gallops Breast:Examined in sitting and supine position were symmetrical in appearance, no palpable masses or tenderness,  no skin retraction, no nipple inversion, no nipple discharge, no skin discoloration, no axillary or supraclavicular lymphadenopathy Abdomen: no palpable masses or tenderness, no rebound or guarding Extremities: no edema or skin discoloration or tenderness  Pelvic:  Bartholin, Urethra, Skene Glands: Within normal limits             Vagina: No gross lesions or discharge  Cervix: No gross lesions or discharge  Uterus  anteverted, normal size, shape and consistency, non-tender and mobile  Adnexa  Without masses or tenderness  Anus and perineum  normal   Rectovaginal  normal sphincter tone without palpated masses or tenderness             Hemoccult not indicated     Assessment/Plan:  25 y.o. female for annual exam having normal menstrual cycles on oral contraceptive pill. For STD screen the following tests ordered today: GC and Chlamydia culture, HIV, RPR, hepatitis B and C. Also a CBC and urinalysis is obtained today. Pap smear not indicated. Patient refuses flu vaccine.   Ok EdwardsFERNANDEZ,JUAN H MD, 2:54 PM 06/03/2016

## 2016-06-04 LAB — HEPATITIS C ANTIBODY: HCV AB: NEGATIVE

## 2016-06-04 LAB — HEPATITIS B SURFACE ANTIGEN: Hepatitis B Surface Ag: NEGATIVE

## 2016-06-04 LAB — GC/CHLAMYDIA PROBE AMP
CT PROBE, AMP APTIMA: NOT DETECTED
GC Probe RNA: NOT DETECTED

## 2016-06-04 LAB — RPR

## 2016-06-04 LAB — HIV ANTIBODY (ROUTINE TESTING W REFLEX): HIV: NONREACTIVE

## 2017-01-04 ENCOUNTER — Encounter: Payer: Self-pay | Admitting: Gynecology

## 2017-01-11 ENCOUNTER — Ambulatory Visit: Payer: 59 | Admitting: Podiatry

## 2017-01-23 ENCOUNTER — Ambulatory Visit (INDEPENDENT_AMBULATORY_CARE_PROVIDER_SITE_OTHER): Payer: 59 | Admitting: Podiatry

## 2017-01-23 ENCOUNTER — Encounter: Payer: Self-pay | Admitting: Podiatry

## 2017-01-23 VITALS — BP 100/70 | HR 58

## 2017-01-23 DIAGNOSIS — L84 Corns and callosities: Secondary | ICD-10-CM | POA: Diagnosis not present

## 2017-01-23 NOTE — Progress Notes (Signed)
   Subjective: Patient presents to the office today for chief complaint of painful callus lesions of the right fifth toe that has been present for the past 3-4 years. Wearing closed toed shoes increases her pain. There are no alleviating factors noted. She has not done anything to treat the symptoms. Patient presents today for further treatment and evaluation.  Objective:  Physical Exam General: Alert and oriented x3 in no acute distress  Dermatology: Hyperkeratotic lesion present on the interdigital space between digits 4-5 of the right foot. Pain on palpation with a central nucleated core noted.  Skin is warm, dry and supple bilateral lower extremities. Negative for open lesions or macerations.  Vascular: Palpable pedal pulses bilaterally. No edema or erythema noted. Capillary refill within normal limits.  Neurological: Epicritic and protective threshold grossly intact bilaterally.   Musculoskeletal Exam: Pain on palpation at the keratotic lesion noted. Range of motion within normal limits bilateral. Muscle strength 5/5 in all groups bilateral.  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  #3 Treated area(s) with Salinocaine and dressed with light dressing. #4 silicone toe caps dispensed. #5 return to clinic when necessary.  Felecia ShellingBrent M. Evans, DPM [facility]  Dr. Felecia ShellingBrent M. Evans, DPM    [address]                                        [place], [place] [address]                Office [phone]  Fax [phone]

## 2017-02-21 IMAGING — US US ABDOMEN COMPLETE
1 series · 14 of 25 positions shown · non-contrast
Comparison: None.

CLINICAL DATA: Epigastric abdominal pain.

EXAM:
ABDOMEN ULTRASOUND COMPLETE

[Series 1: us abdomen complete · 0.19mm/px · 14 of 77 slices shown]
[im 1/77]
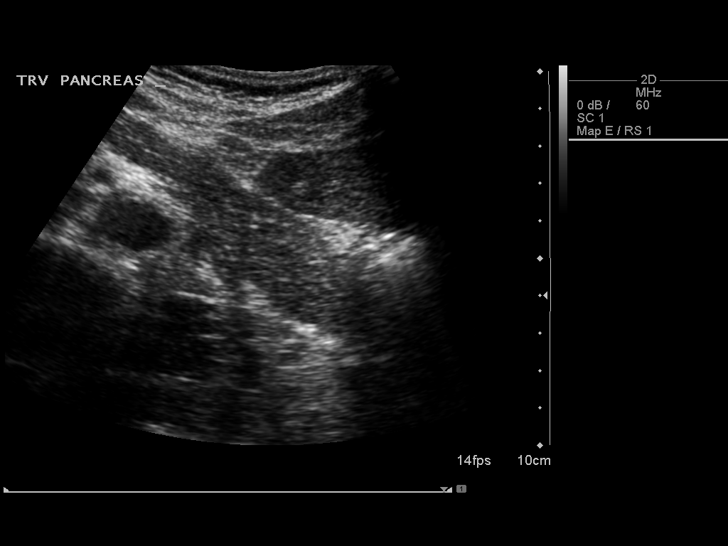
[im 7/77]
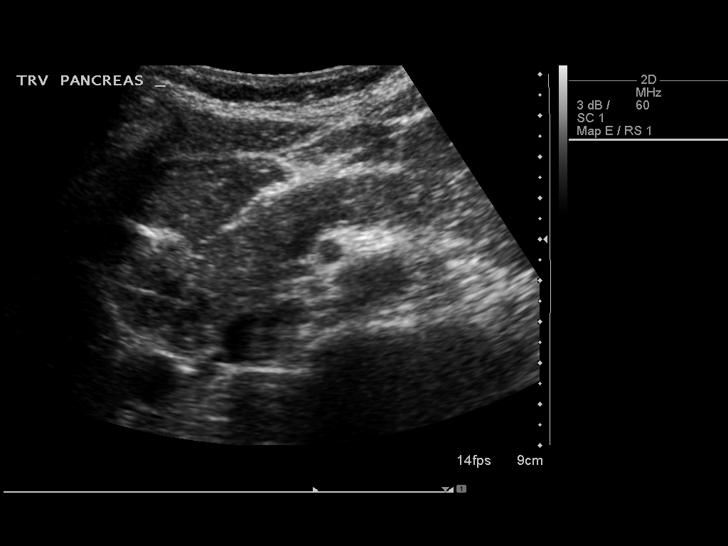
[im 13/77]
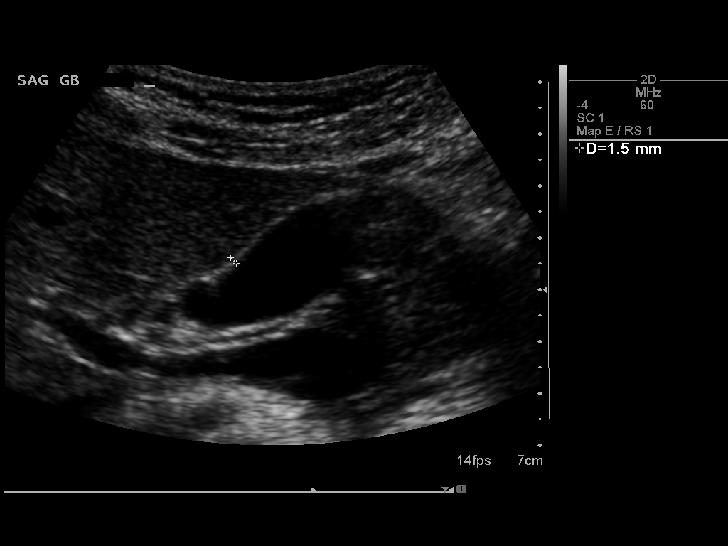
[im 20/77]
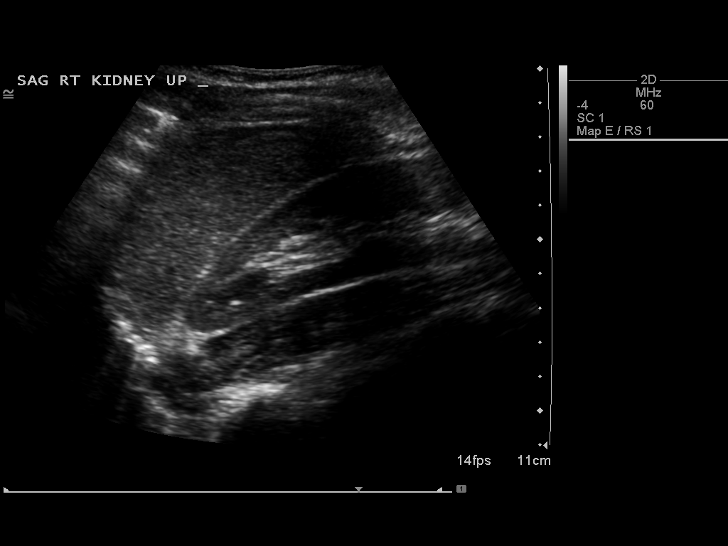
[im 26/77]
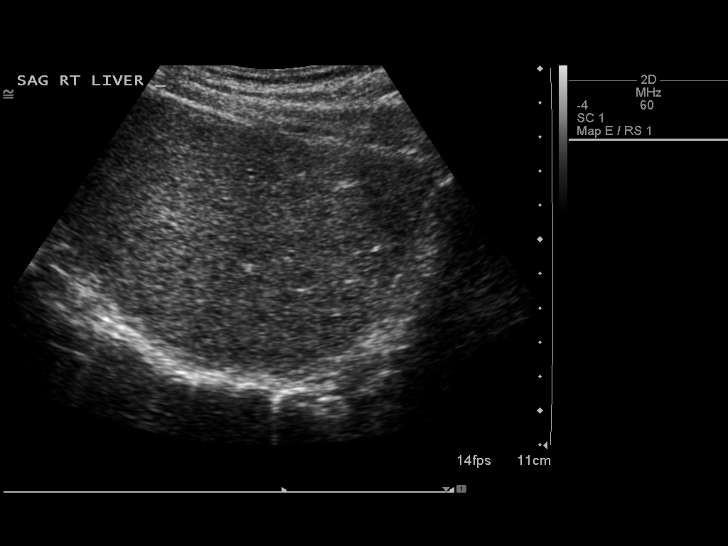
[im 29/77]
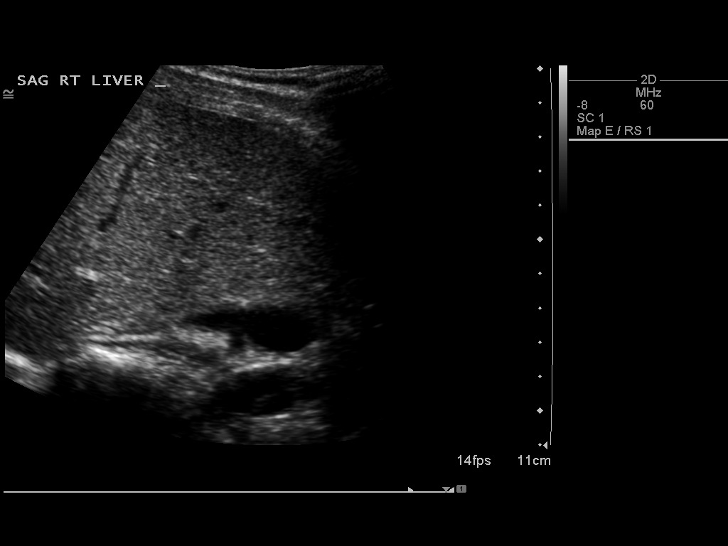
[im 35/77]
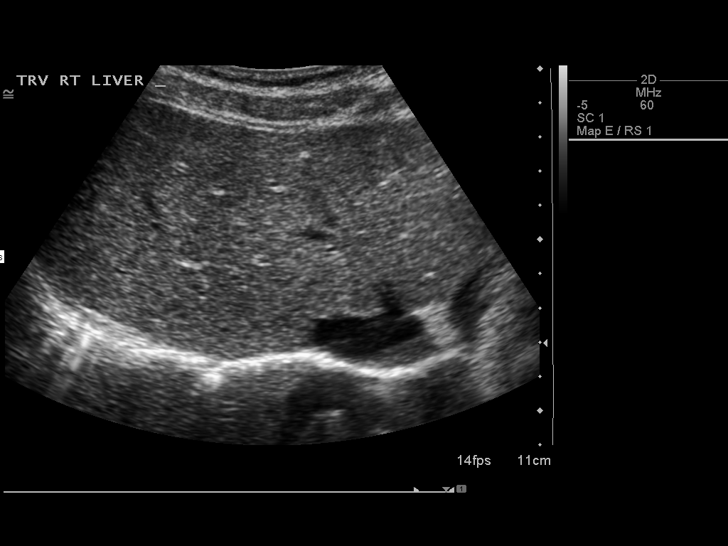
[im 42/77]
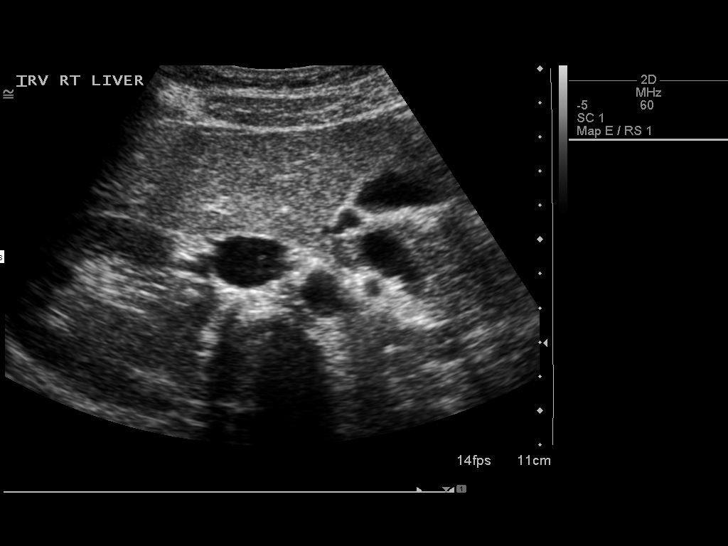
[im 48/77]
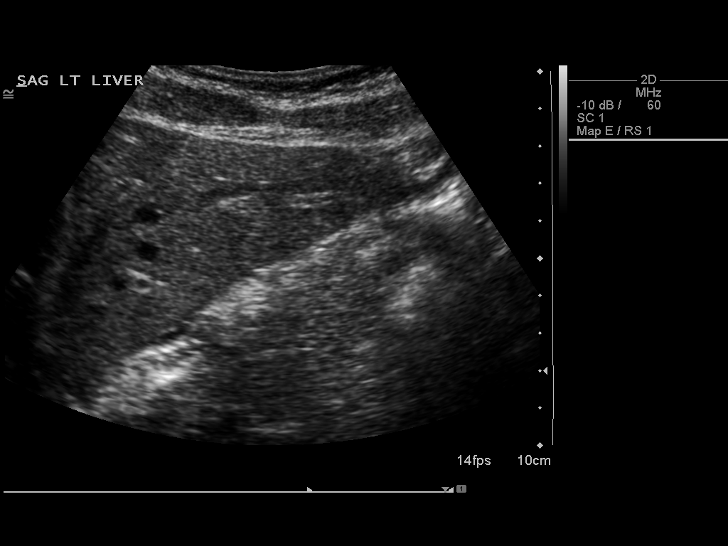
[im 51/77]
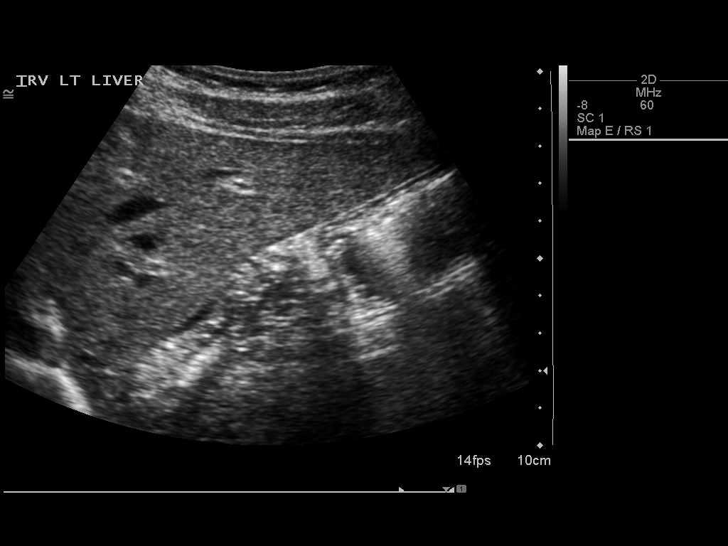
[im 58/77]
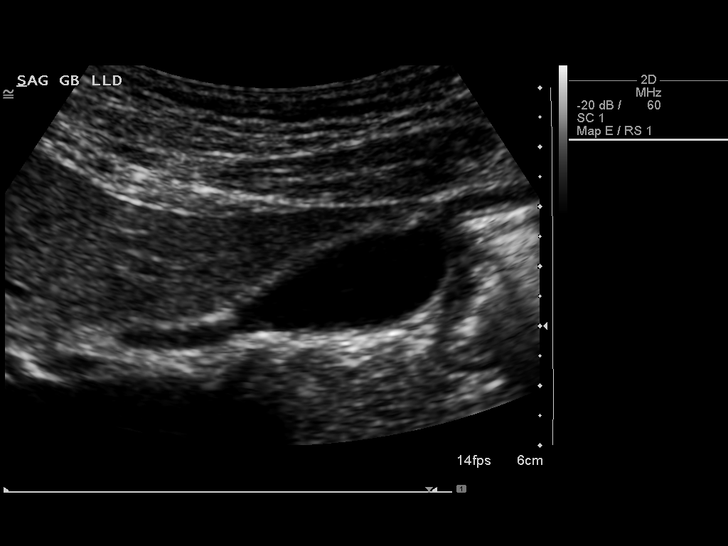
[im 64/77]
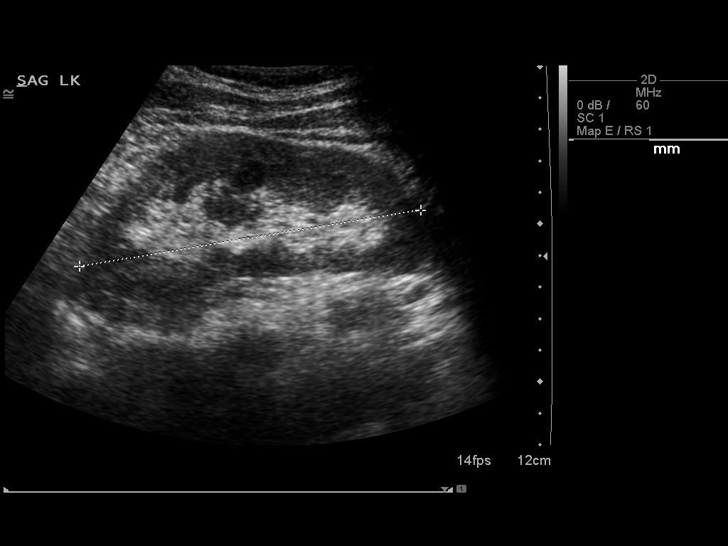
[im 70/77]
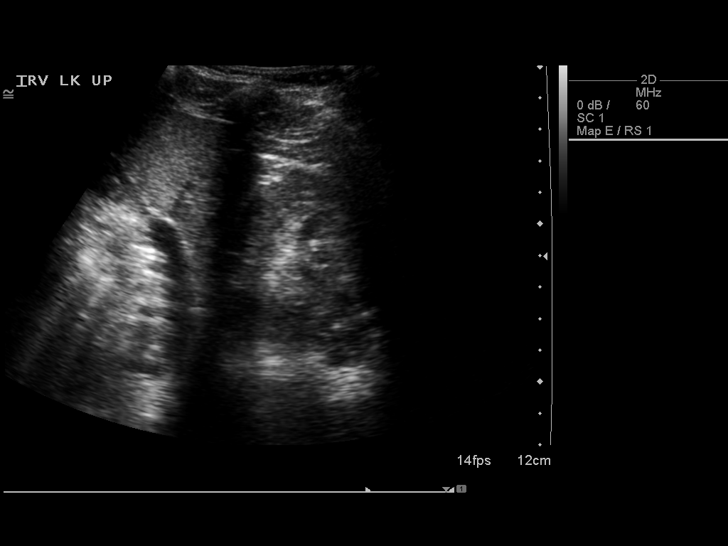
[im 77/77]
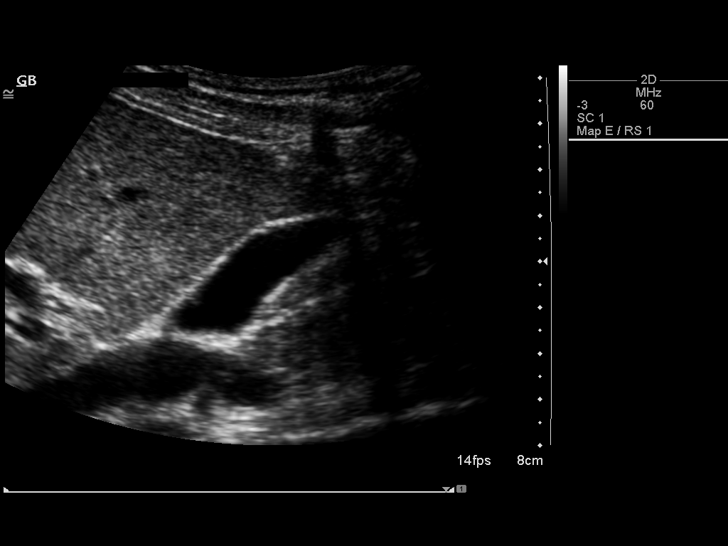

[14 of 25 positions shown; findings below may reference images not displayed]

FINDINGS: Gallbladder: No gallstones or wall thickening visualized. No
sonographic Murphy sign noted by sonographer.

Common bile duct: Diameter: 2.8 mm which is within normal limits.

Liver: No focal lesion identified. Within normal limits in
parenchymal echogenicity.

IVC: No abnormality visualized.

Pancreas: Visualized portion unremarkable.

Spleen: Size and appearance within normal limits.

Right Kidney: Length: 11.6 cm. Echogenicity within normal limits. No
mass or hydronephrosis visualized.

Left Kidney: Length: 11.4 cm. Echogenicity within normal limits. No
mass or hydronephrosis visualized.

Abdominal aorta: No aneurysm visualized.

Other findings: None.
IMPRESSION: No abnormality seen in the abdomen.

## 2017-07-05 ENCOUNTER — Other Ambulatory Visit: Payer: Self-pay
